# Patient Record
Sex: Male | Born: 1980 | Race: Black or African American | Hispanic: No | Marital: Single | Smoking: Never smoker
Health system: Southern US, Community
[De-identification: ages and names within clinical notes are randomized; demographics above are authoritative.]

## PROBLEM LIST (undated history)

## (undated) DIAGNOSIS — J45909 Unspecified asthma, uncomplicated: Secondary | ICD-10-CM

---

## 2007-09-19 ENCOUNTER — Inpatient Hospital Stay (HOSPITAL_COMMUNITY): Admission: EM | Admit: 2007-09-19 | Discharge: 2007-09-21 | Payer: Self-pay | Admitting: Emergency Medicine
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
## 2011-03-27 LAB — BASIC METABOLIC PANEL
BUN: 10
BUN: 6
CO2: 22
Calcium: 8.6
Chloride: 103
Creatinine, Ser: 0.91
Creatinine, Ser: 1.14
GFR calc Af Amer: >60
GFR calc non Af Amer: >60
GFR calc non Af Amer: >60
Glucose, Bld: 107 — ABNORMAL HIGH
Potassium: 4.6
Sodium: 137

## 2011-03-27 LAB — CBC
HCT: 40.9
MCHC: 33
MCV: 82.8
Platelets: 221
Platelets: 244
RBC: 4.94
RDW: 13.9
WBC: 10.6 — ABNORMAL HIGH

## 2011-03-27 LAB — DIFFERENTIAL
Basophils Absolute: 0
Basophils Relative: 0
Lymphocytes Relative: 7 — ABNORMAL LOW
Monocytes Absolute: 0.5
Neutro Abs: 11.5 — ABNORMAL HIGH
Neutrophils Relative %: 90 — ABNORMAL HIGH

## 2018-07-28 ENCOUNTER — Encounter (HOSPITAL_COMMUNITY): Payer: Self-pay

## 2018-07-28 ENCOUNTER — Emergency Department (HOSPITAL_COMMUNITY)
Admission: EM | Admit: 2018-07-28 | Discharge: 2018-07-28 | Disposition: A | Discharge status: Home / Self Care | Payer: BLUE CROSS/BLUE SHIELD | Attending: Emergency Medicine | Admitting: Emergency Medicine

## 2018-07-28 ENCOUNTER — Emergency Department (HOSPITAL_COMMUNITY): Payer: BLUE CROSS/BLUE SHIELD

## 2018-07-28 ENCOUNTER — Other Ambulatory Visit: Payer: Self-pay

## 2018-07-28 DIAGNOSIS — S199XXA Unspecified injury of neck, initial encounter: Secondary | ICD-10-CM | POA: Diagnosis present

## 2018-07-28 DIAGNOSIS — S161XXA Strain of muscle, fascia and tendon at neck level, initial encounter: Secondary | ICD-10-CM | POA: Diagnosis not present

## 2018-07-28 DIAGNOSIS — Y939 Activity, unspecified: Secondary | ICD-10-CM | POA: Diagnosis not present

## 2018-07-28 DIAGNOSIS — J45909 Unspecified asthma, uncomplicated: Secondary | ICD-10-CM | POA: Diagnosis not present

## 2018-07-28 DIAGNOSIS — Y999 Unspecified external cause status: Secondary | ICD-10-CM | POA: Insufficient documentation

## 2018-07-28 DIAGNOSIS — Y92410 Unspecified street and highway as the place of occurrence of the external cause: Secondary | ICD-10-CM | POA: Insufficient documentation

## 2018-07-28 HISTORY — DX: Unspecified asthma, uncomplicated: J45.909

## 2018-07-28 MED ORDER — CYCLOBENZAPRINE HCL 10 MG PO TABS: 10.0000 mg | ORAL_TABLET | Freq: Two times a day (BID) | ORAL | 0 refills | Status: AC | PRN

## 2018-07-28 MED ORDER — IBUPROFEN 600 MG PO TABS: 600.0000 mg | ORAL_TABLET | Freq: Four times a day (QID) | ORAL | 0 refills | Status: AC | PRN
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
## 2018-07-28 NOTE — Discharge Instructions (Addendum)
Do not drive while taking the muscle relaxer as it will make you sleepy. Return as needed for worsening symptoms.  °
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
